# Patient Record
Sex: Male | Born: 1970 | Race: Black or African American | Hispanic: No | Marital: Single
Health system: Southern US, Community
[De-identification: ages and names within clinical notes are randomized; demographics above are authoritative.]

---

## 2020-11-01 ENCOUNTER — Other Ambulatory Visit: Payer: Self-pay

## 2020-11-01 ENCOUNTER — Emergency Department (HOSPITAL_BASED_OUTPATIENT_CLINIC_OR_DEPARTMENT_OTHER): Payer: 59

## 2020-11-01 ENCOUNTER — Emergency Department (HOSPITAL_BASED_OUTPATIENT_CLINIC_OR_DEPARTMENT_OTHER)
Admission: EM | Admit: 2020-11-01 | Discharge: 2020-11-01 | Disposition: A | Discharge status: Home / Self Care | Payer: 59 | Attending: Emergency Medicine | Admitting: Emergency Medicine

## 2020-11-01 DIAGNOSIS — R001 Bradycardia, unspecified: Secondary | ICD-10-CM | POA: Diagnosis not present

## 2020-11-01 DIAGNOSIS — R0789 Other chest pain: Secondary | ICD-10-CM | POA: Insufficient documentation

## 2020-11-01 DIAGNOSIS — D649 Anemia, unspecified: Secondary | ICD-10-CM | POA: Insufficient documentation

## 2020-11-01 LAB — BASIC METABOLIC PANEL
Anion gap: 8 (ref 5–15)
BUN: 13 mg/dL (ref 6–20)
CO2: 27 mmol/L (ref 22–32)
Calcium: 8.8 mg/dL — ABNORMAL LOW (ref 8.9–10.3)
Chloride: 102 mmol/L (ref 98–111)
Creatinine, Ser: 1.06 mg/dL (ref 0.61–1.24)
GFR, Estimated: >60 mL/min (ref >60)
Glucose, Bld: 93 mg/dL (ref 70–99)
Potassium: 3.9 mmol/L (ref 3.5–5.1)
Sodium: 137 mmol/L (ref 135–145)

## 2020-11-01 LAB — CBC
HCT: 40.1 % (ref 39.0–52.0)
Hemoglobin: 12.9 g/dL — ABNORMAL LOW (ref 13.0–17.0)
MCH: 28.9 pg (ref 26.0–34.0)
MCHC: 32.2 g/dL (ref 30.0–36.0)
MCV: 89.9 fL (ref 80.0–100.0)
Platelets: 211 10*3/uL (ref 150–400)
RBC: 4.46 MIL/uL (ref 4.22–5.81)
RDW: 12 % (ref 11.5–15.5)
WBC: 6.9 10*3/uL (ref 4.0–10.5)
nRBC: 0 % (ref 0.0–0.2)

## 2020-11-01 LAB — TROPONIN I (HIGH SENSITIVITY): Troponin I (High Sensitivity): 2 ng/L (ref <18)

## 2020-11-01 MED ORDER — ALUM & MAG HYDROXIDE-SIMETH 200-200-20 MG/5ML PO SUSP
30.0000 mL | Freq: Once | ORAL | Status: AC
  Administered 2020-11-01: 30 mL via ORAL
  Filled 2020-11-01: qty 30

## 2020-11-01 MED ORDER — LIDOCAINE VISCOUS HCL 2 % MT SOLN
15.0000 mL | Freq: Once | OROMUCOSAL | Status: AC
  Administered 2020-11-01: 15 mL via ORAL
  Filled 2020-11-01: qty 15

## 2020-11-01 MED ORDER — OMEPRAZOLE 20 MG PO CPDR: 20.0000 mg | DELAYED_RELEASE_CAPSULE | Freq: Every day | ORAL | 0 refills | Status: AC

## 2020-11-01 NOTE — Discharge Instructions (Addendum)
Lab work and imaging are reassuring.  Suspect you maybe suffering from acid reflux, starting you on an acid pill please take as prescribed.  Also given you food recommendations help decrease exacerbations of acid reflux.  Please follow-up with your PCP as needed.  Come back to the emergency department if you develop chest pain, shortness of breath, severe abdominal pain, uncontrolled nausea, vomiting, diarrhea.

## 2020-11-01 NOTE — ED Notes (Signed)
EKG reviewed by MD

## 2020-11-01 NOTE — ED Notes (Signed)
ED Provider at bedside. 

## 2020-11-01 NOTE — ED Provider Notes (Signed)
[facility] EMERGENCY DEPARTMENT Provider Note   CSN: [id number] Arrival date & time: 11/01/20  1803     History Chief Complaint  Patient presents with   Chest Pain    Tommy Kim is a 50 y.o. male.  HPI  Patient with no significant medical history presents to the emergency department with chief complaint of left-sided chest pain.  Patient states his chest pain started approximately 3 days ago, states it came on gradually, it is not worsened with exertion, not associated with shortness of breath, becoming diaphoretic, nauseous, vomiting, lightheaded or dizziness.  Patient states he feels pain going down into his left arm, pain is worsened with left arm movement, denies food intake or changing position exacerbates or relieves the pain .  Patient has no cardiac history, no history of PEs or DVTs, currently not on hormone therapy, he denies illicit drug use, nondiabetic, no history of hypertension, no history of connective tissue disorders, or aneurysms.  Patient denies fevers, chills, shortness of breath, abdominal pain, general body aches.  No past medical history on file.  There are no problems to display for this patient.       No family history on file.     Home Medications Prior to Admission medications   Medication Sig Start Date End Date Taking? Authorizing Provider  omeprazole (PRILOSEC) 20 MG capsule Take 1 capsule (20 mg total) by mouth daily. 11/01/20 12/01/20 Yes Carroll Sage, PA-C    Allergies    Patient has no allergy information on record.  Review of Systems   Review of Systems  Constitutional:  Negative for chills and fever.  HENT:  Negative for congestion.   Respiratory:  Negative for shortness of breath.   Cardiovascular:  Positive for chest pain.  Gastrointestinal:  Negative for abdominal pain.  Genitourinary:  Negative for enuresis.  Musculoskeletal:  Negative for back pain.  Skin:  Negative for rash.  Neurological:  Negative for  dizziness.  Hematological:  Does not bruise/bleed easily.   Physical Exam Updated Vital Signs BP 116/77   Pulse (!) 56   Temp 98.5 F (36.9 C) (Oral)   Resp 18   Ht 5\' 11"  (1.803 m)   Wt 81.6 kg   SpO2 99%   BMI 25.10 kg/m   Physical Exam Vitals and nursing note reviewed.  Constitutional:      General: He is not in acute distress.    Appearance: He is not ill-appearing.  HENT:     Head: Normocephalic and atraumatic.     Nose: No congestion.  Eyes:     Conjunctiva/sclera: Conjunctivae normal.  Neck:     Vascular: No carotid bruit.  Cardiovascular:     Rate and Rhythm: Normal rate and regular rhythm.     Pulses: Normal pulses.     Heart sounds: No murmur heard.   No friction rub. No gallop.     Comments: Chest was palpated cannot reproduce chest pain. Pulmonary:     Effort: No respiratory distress.     Breath sounds: No wheezing, rhonchi or rales.  Musculoskeletal:     Right lower leg: No edema.     Left lower leg: No edema.     Comments: Spine was palpated was nontender to palpation.  Patient has full range of motion in his upper extremities, neurovascular fully intact.  Skin:    General: Skin is warm and dry.  Neurological:     Mental Status: He is alert.  Psychiatric:  Mood and Affect: Mood normal.    ED Results / Procedures / Treatments   Labs (all labs ordered are listed, but only abnormal results are displayed) Labs Reviewed  BASIC METABOLIC PANEL - Abnormal; Notable for the following components:      Result Value   Calcium 8.8 (*)    All other components within normal limits  CBC - Abnormal; Notable for the following components:   Hemoglobin 12.9 (*)    All other components within normal limits  TROPONIN I (HIGH SENSITIVITY)    EKG EKG Interpretation  Date/Time:  Friday November 01 2020 18:08:27 EDT Ventricular Rate:  57 PR Interval:  190 QRS Duration: 83 QT Interval:  373 QTC Calculation: 364 R Axis:   71 Text Interpretation: Sinus  rhythm RSR' in V1 or V2, probably normal variant Probable left ventricular hypertrophy Inferior infarct, acute (LCx) ST elevation, consider anterolateral injury no reciprocal changes Confirmed by Rolan Bucco [id number]) on 11/01/2020 6:13:14 PM  Radiology DG Chest 2 View  Result Date: 11/01/2020 CLINICAL DATA:  Left chest pain for 3-4 days, radiating to the left arm EXAM: CHEST - 2 VIEW COMPARISON:  None. FINDINGS: No consolidation, features of edema, pneumothorax, or effusion. Pulmonary vascularity is normally distributed. The cardiomediastinal contours are unremarkable. No acute osseous or soft tissue abnormality. Telemetry leads overlie the chest. IMPRESSION: No acute cardiopulmonary abnormality. Electronically Signed   By: Kreg Shropshire M.D.   On: 11/01/2020 18:55    Procedures Procedures   Medications Ordered in ED Medications  alum & mag hydroxide-simeth (MAALOX/MYLANTA) 200-200-20 MG/5ML suspension 30 mL (30 mLs Oral Given 11/01/20 1836)    And  lidocaine (XYLOCAINE) 2 % viscous mouth solution 15 mL (15 mLs Oral Given 11/01/20 1836)    ED Course  I have reviewed the triage vital signs and the nursing notes.  Pertinent labs & imaging results that were available during my care of the patient were reviewed by me and considered in my medical decision making (see chart for details).    MDM Rules/Calculators/A&P                         Initial impression-patient presents with chest pain.  X3 days he is alert, does not appear in acute stress, vital signs reassuring.  Will obtain chest pain work-up, provide patient with GI cocktail and reassess.  Work-up-CBC shows slight normocytic anemia with a hemoglobin 12.9, BMP unremarkable, first troponin is 2.  Chest x-ray unremarkable.  EKG sinus bradycardia showing diffuse ST elevation in the lateral leads, without reciprocal changes.  Reassessment-patient was reassessed after GI cocktail, states he is feeling much better, pain went from a 3 down to  1, states he is feeling much better.  Has no complaints this time.  Vital signs remained stable, patient is agreement for discharge.  Rule out- I have low suspicion for ACS as history is atypical, patient has no cardiac history, EKG does show a slight elevation in the lateral leads but there is no corresponding reciprocal changes, no T wave inversions, no deep Q waves, I suspect this is a normal variant for the patient.  Initial troponin is 2, will defer checking troponin as patient has been having continual chest pain for last 3 days, would expect elevation if ACS was present..  Low suspicion for PE as patient denies pleuritic chest pain, shortness of breath, patient denies leg pain, no pedal edema noted on exam, patient was PERC negative.  Low suspicion for AAA  or aortic dissection as history is atypical, patient has low risk factors.  Low suspicion for systemic infection as patient is nontoxic-appearing, vital signs reassuring, no obvious source infection noted on exam.   Plan-  Left-sided chest pain since resolved-I suspect patient suffering from acid reflux as pain improved with GI cocktail.  We will provide him with PPI, follow-up with PCP for further evaluation.  Vital signs have remained stable, no indication for hospital admission.  Patient discussed with attending and they agreed with assessment and plan.  Patient given at home care as well strict return precautions.  Patient verbalized that they understood agreed to said plan.  Final Clinical Impression(s) / ED Diagnoses Final diagnoses:  Atypical chest pain    Rx / DC Orders ED Discharge Orders          Ordered    omeprazole (PRILOSEC) 20 MG capsule  Daily        11/01/20 1946             Barnie Del 11/01/20 1947    Rolan Bucco, MD 11/01/20 2324

## 2020-11-01 NOTE — ED Notes (Signed)
Patient transported to XR. 

## 2020-11-01 NOTE — ED Notes (Signed)
Blood sent to lab

## 2020-11-01 NOTE — ED Triage Notes (Signed)
Chest pain L side of chest for 3 or 4 days, radiates to L arm. Worse when looking down.

## 2020-11-01 NOTE — ED Notes (Signed)
Return from CT. On monitor.

## 2022-06-27 IMAGING — DX DG CHEST 2V
2 series · 2 of 2 positions shown · non-contrast
Comparison: None.

CLINICAL DATA: Left chest pain for 3-4 days, radiating to the left
arm

EXAM:
CHEST - 2 VIEW

[chest pa]
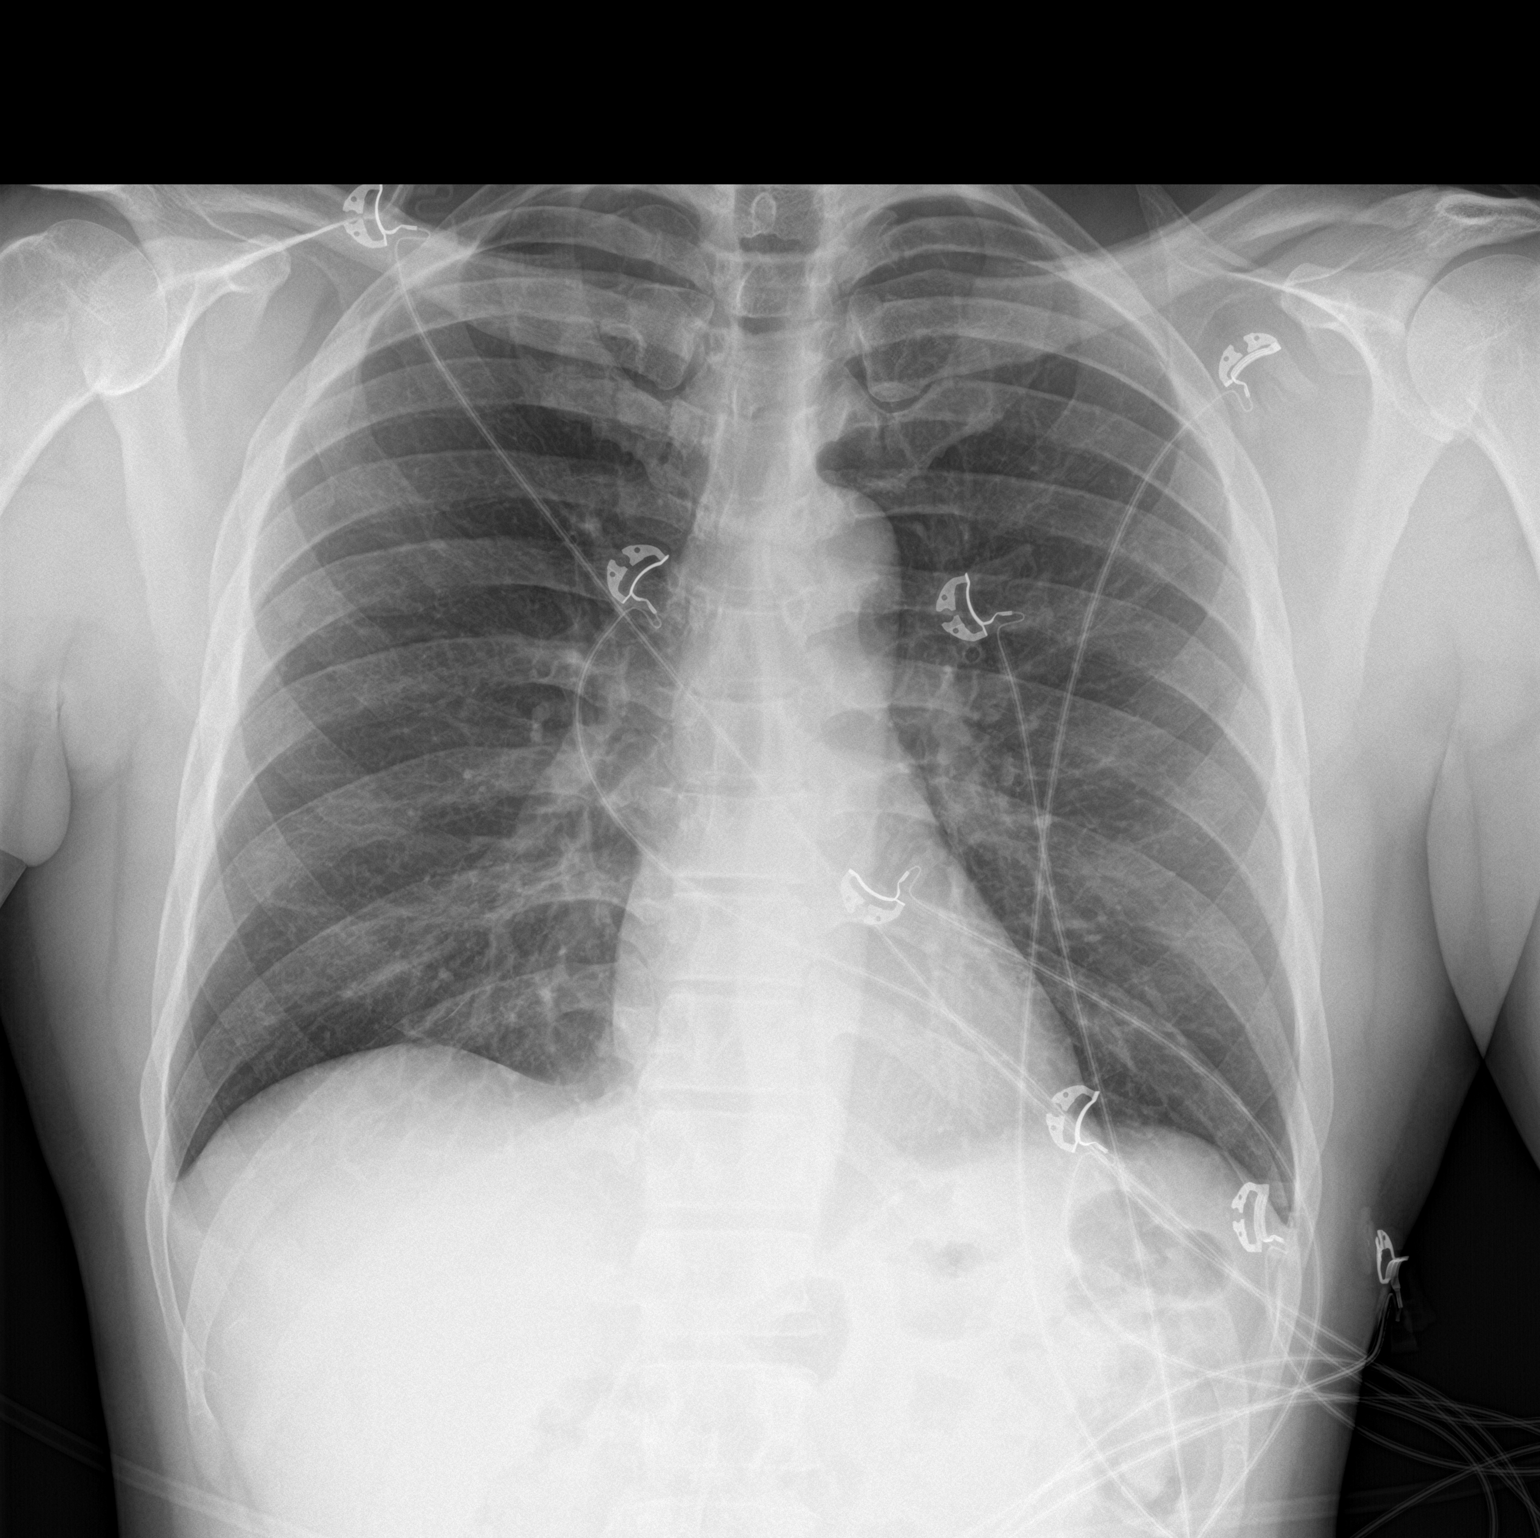

[chest lat]
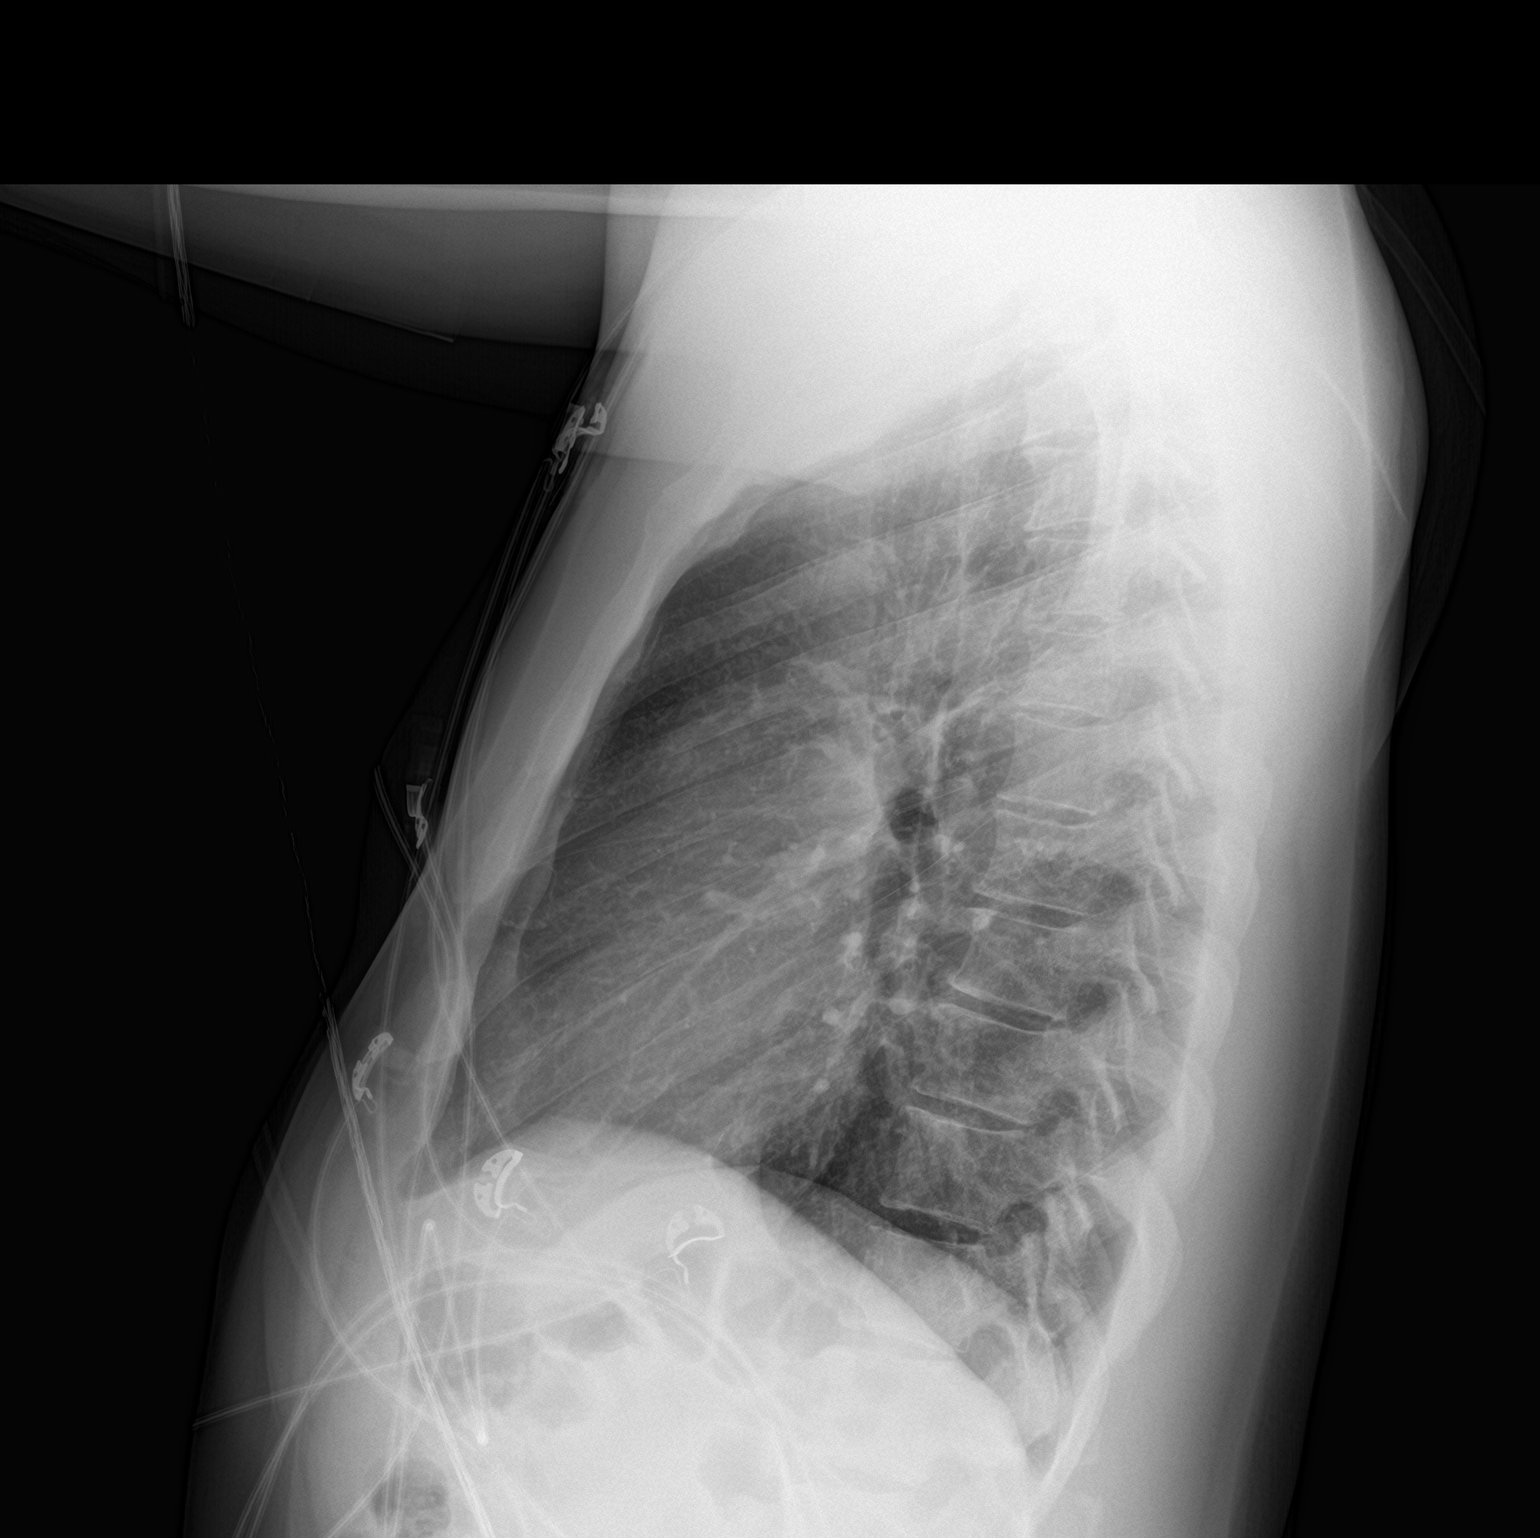

[2 of 2 positions shown; findings below may reference images not displayed]

FINDINGS: No consolidation, features of edema, pneumothorax, or effusion.
Pulmonary vascularity is normally distributed. The cardiomediastinal
contours are unremarkable. No acute osseous or soft tissue
abnormality. Telemetry leads overlie the chest.
IMPRESSION: No acute cardiopulmonary abnormality.
